# Patient Record
Sex: Female | Born: 1965 | Race: Black or African American | Hispanic: No | Marital: Married
Health system: Southern US, Community
[De-identification: ages and names within clinical notes are randomized; demographics above are authoritative.]

## PROBLEM LIST (undated history)

## (undated) HISTORY — PX: TONSILLECTOMY: SUR1361

## (undated) HISTORY — PX: ABDOMINAL HYSTERECTOMY: SHX81

---

## 1997-11-09 ENCOUNTER — Observation Stay (HOSPITAL_COMMUNITY): Admission: RE | Admit: 1997-11-09 | Discharge: 1997-11-10 | Payer: Self-pay | Admitting: *Deleted

## 1997-11-09 ENCOUNTER — Ambulatory Visit (HOSPITAL_COMMUNITY): Admission: RE | Admit: 1997-11-09 | Discharge: 1997-11-09 | Payer: Self-pay | Admitting: Obstetrics and Gynecology

## 1998-08-25 ENCOUNTER — Encounter: Payer: Self-pay | Admitting: Emergency Medicine

## 1998-08-25 ENCOUNTER — Emergency Department (HOSPITAL_COMMUNITY): Admission: EM | Admit: 1998-08-25 | Discharge: 1998-08-25 | Payer: Self-pay | Admitting: Emergency Medicine

## 1999-04-29 ENCOUNTER — Encounter: Admission: RE | Admit: 1999-04-29 | Discharge: 1999-04-29 | Payer: Self-pay | Admitting: Oncology

## 1999-04-29 ENCOUNTER — Encounter: Payer: Self-pay | Admitting: Oncology

## 2000-08-30 ENCOUNTER — Emergency Department (HOSPITAL_COMMUNITY): Admission: EM | Admit: 2000-08-30 | Discharge: 2000-08-30 | Payer: Self-pay | Admitting: Emergency Medicine

## 2000-08-30 ENCOUNTER — Encounter: Payer: Self-pay | Admitting: Emergency Medicine

## 2000-12-02 ENCOUNTER — Other Ambulatory Visit: Admission: RE | Admit: 2000-12-02 | Discharge: 2000-12-02 | Payer: Self-pay | Admitting: Obstetrics and Gynecology

## 2001-12-03 ENCOUNTER — Emergency Department (HOSPITAL_COMMUNITY): Admission: EM | Admit: 2001-12-03 | Discharge: 2001-12-03 | Payer: Self-pay | Admitting: Emergency Medicine

## 2001-12-04 ENCOUNTER — Encounter: Payer: Self-pay | Admitting: Emergency Medicine

## 2004-05-23 ENCOUNTER — Emergency Department (HOSPITAL_COMMUNITY): Admission: EM | Admit: 2004-05-23 | Discharge: 2004-05-23 | Payer: Self-pay | Admitting: Emergency Medicine

## 2004-08-29 ENCOUNTER — Encounter: Admission: RE | Admit: 2004-08-29 | Discharge: 2004-08-29 | Payer: Self-pay | Admitting: Family Medicine

## 2004-10-24 ENCOUNTER — Encounter: Admission: RE | Admit: 2004-10-24 | Discharge: 2004-10-24 | Payer: Self-pay | Admitting: Family Medicine

## 2006-11-14 ENCOUNTER — Emergency Department (HOSPITAL_COMMUNITY): Admission: EM | Admit: 2006-11-14 | Discharge: 2006-11-14 | Payer: Self-pay | Admitting: Emergency Medicine

## 2007-08-21 ENCOUNTER — Emergency Department (HOSPITAL_COMMUNITY): Admission: EM | Admit: 2007-08-21 | Discharge: 2007-08-21 | Payer: Self-pay | Admitting: Emergency Medicine

## 2009-12-07 ENCOUNTER — Ambulatory Visit (HOSPITAL_BASED_OUTPATIENT_CLINIC_OR_DEPARTMENT_OTHER)
Admission: RE | Admit: 2009-12-07 | Discharge: 2009-12-07 | Payer: Self-pay | Source: Home / Self Care | Admitting: Urology

## 2010-01-27 ENCOUNTER — Encounter: Payer: Self-pay | Admitting: *Deleted

## 2010-09-30 ENCOUNTER — Other Ambulatory Visit: Payer: Self-pay | Admitting: Family Medicine

## 2010-09-30 DIAGNOSIS — Z1231 Encounter for screening mammogram for malignant neoplasm of breast: Secondary | ICD-10-CM

## 2010-10-03 ENCOUNTER — Ambulatory Visit
Admission: RE | Admit: 2010-10-03 | Discharge: 2010-10-03 | Disposition: A | Discharge status: Home / Self Care | Payer: 59 | Source: Ambulatory Visit | Attending: Family Medicine | Admitting: Family Medicine

## 2010-10-03 DIAGNOSIS — Z1231 Encounter for screening mammogram for malignant neoplasm of breast: Secondary | ICD-10-CM

## 2010-10-04 ENCOUNTER — Other Ambulatory Visit: Payer: Self-pay | Admitting: Family Medicine

## 2010-10-04 DIAGNOSIS — N63 Unspecified lump in unspecified breast: Secondary | ICD-10-CM

## 2010-12-17 ENCOUNTER — Other Ambulatory Visit: Payer: Self-pay | Admitting: Family Medicine

## 2010-12-17 DIAGNOSIS — Z1231 Encounter for screening mammogram for malignant neoplasm of breast: Secondary | ICD-10-CM

## 2010-12-27 ENCOUNTER — Ambulatory Visit
Admission: RE | Admit: 2010-12-27 | Discharge: 2010-12-27 | Disposition: A | Discharge status: Home / Self Care | Payer: 59 | Source: Ambulatory Visit | Attending: Family Medicine | Admitting: Family Medicine

## 2010-12-27 DIAGNOSIS — Z1231 Encounter for screening mammogram for malignant neoplasm of breast: Secondary | ICD-10-CM

## 2011-01-06 ENCOUNTER — Other Ambulatory Visit: Payer: Self-pay | Admitting: Obstetrics and Gynecology

## 2011-01-06 DIAGNOSIS — R928 Other abnormal and inconclusive findings on diagnostic imaging of breast: Secondary | ICD-10-CM

## 2011-01-17 ENCOUNTER — Other Ambulatory Visit: Payer: Self-pay | Admitting: Obstetrics and Gynecology

## 2011-01-17 ENCOUNTER — Ambulatory Visit
Admission: RE | Admit: 2011-01-17 | Discharge: 2011-01-17 | Disposition: A | Discharge status: Home / Self Care | Payer: 59 | Source: Ambulatory Visit | Attending: Obstetrics and Gynecology | Admitting: Obstetrics and Gynecology

## 2011-01-17 DIAGNOSIS — R928 Other abnormal and inconclusive findings on diagnostic imaging of breast: Secondary | ICD-10-CM

## 2011-07-07 ENCOUNTER — Other Ambulatory Visit: Payer: Self-pay | Admitting: Family Medicine

## 2011-07-07 DIAGNOSIS — R921 Mammographic calcification found on diagnostic imaging of breast: Secondary | ICD-10-CM

## 2011-07-18 ENCOUNTER — Ambulatory Visit
Admission: RE | Admit: 2011-07-18 | Discharge: 2011-07-18 | Disposition: A | Discharge status: Home / Self Care | Payer: 59 | Source: Ambulatory Visit | Attending: Family Medicine | Admitting: Family Medicine

## 2011-07-18 ENCOUNTER — Other Ambulatory Visit: Payer: Self-pay | Admitting: Family Medicine

## 2011-07-18 DIAGNOSIS — R921 Mammographic calcification found on diagnostic imaging of breast: Secondary | ICD-10-CM

## 2012-12-08 ENCOUNTER — Other Ambulatory Visit: Payer: Self-pay | Admitting: Family Medicine

## 2013-03-07 ENCOUNTER — Other Ambulatory Visit: Payer: Self-pay | Admitting: Family Medicine

## 2013-03-07 DIAGNOSIS — R921 Mammographic calcification found on diagnostic imaging of breast: Secondary | ICD-10-CM

## 2013-03-07 DIAGNOSIS — D241 Benign neoplasm of right breast: Secondary | ICD-10-CM

## 2013-03-14 ENCOUNTER — Ambulatory Visit
Admission: RE | Admit: 2013-03-14 | Discharge: 2013-03-14 | Disposition: A | Discharge status: Home / Self Care | Payer: 59 | Source: Ambulatory Visit | Attending: Family Medicine | Admitting: Family Medicine

## 2013-03-14 DIAGNOSIS — R921 Mammographic calcification found on diagnostic imaging of breast: Secondary | ICD-10-CM

## 2013-03-14 DIAGNOSIS — D241 Benign neoplasm of right breast: Secondary | ICD-10-CM

## 2013-06-09 ENCOUNTER — Encounter: Payer: Self-pay | Admitting: Dietician

## 2013-06-09 ENCOUNTER — Encounter: Payer: 59 | Attending: Family Medicine | Admitting: Dietician

## 2013-06-09 VITALS — Ht 68.0 in | Wt 168.0 lb

## 2013-06-09 DIAGNOSIS — Z713 Dietary counseling and surveillance: Secondary | ICD-10-CM | POA: Insufficient documentation

## 2013-06-09 DIAGNOSIS — E663 Overweight: Secondary | ICD-10-CM

## 2013-06-09 DIAGNOSIS — Z6825 Body mass index (BMI) 25.0-25.9, adult: Secondary | ICD-10-CM | POA: Insufficient documentation

## 2013-06-09 NOTE — Patient Instructions (Signed)
For breakfast, try having either oatmeal with yogurt, a boiled egg and toast, or peanut butter with a rice cake, toast, or fruit, Have protein with carbs at meals and snacks. Try adding a small amount of carbs at meals.  Try to eat 3 meals a day. Work on getting enough sleep if possible.  Aim to get 30-60 minutes 5 x week when possible.

## 2013-06-09 NOTE — Progress Notes (Signed)
  Medical Nutrition Therapy:  Appt start time: 7824 end time:  1100.  Assessment:  Primary concerns today: Donna Gomez is here today since she is trying to lose weight and is having difficulty. Feels like she eats healthy and has maintaining the same weight for the past 8-9 months. Has always maintained weight for past 10-15 years and moved to [facility] for work in 2013 and gained about 10-15 lbs at that time. When she moved to [facility] in July 2014 she gained 20 more lbs. Before moving to [facility] she was 140-145 lbs. Was previously a size 4-6.   In the past has lost weight by cutting carbs. Allows herself to "badly" on Friday and Saturdays but never eats that badly.    Lives with her daughter and niece who are there temporarily. Cooks 3-4 x week and does not cook much on the weekend (goes out to eat). Bakes, broils, or grills and does not cook with salt.  Recovering from a broken foot and can't do as much activity as before. States that she is not sleeping well and feeling a lot of stress since she is unable to lose weight. Discussed the role that lack of adequate sleep and stress may play in difficulty with weight loss.   Preferred Learning Style:   No preference indicated   Learning Readiness:   Not ready  Contemplating  Ready  Change in progress  MEDICATIONS: see list   DIETARY INTAKE:  Usual eating pattern includes 3 meals and 1-2 snacks per day.  Everyday foods include salad.  Avoided foods include red meat, pork, chocolate, fried, ice cream  24-hr recall:  B ( AM): coffee with 2 splenda and coffee mate powder Snk ( AM): greek 100 calorie yogurt at work and fruit or smoothie with frozen fruit, coconut water, protein powder, and kale L ( PM): cafeteria - salad bar with lettuce and chicken sometimes cheese with fat free dressing or tuna on wheat or salad/turkey sandwich or protein shake or skips 1-2 x week Snk ( PM): fiber 1 bar or rice cakes and peanut butter or fruit  sometimes D ( PM): chicken/turkey/salmon with salads or vegetables Snk ( PM): nothing usually, on weekend may have goldfish or teddy grahams Beverages: water or coffee very rarely will have diet pepsi  Usual physical activity: physical therapy 2 x week, walking dog in morning, elliptical, healing after broken foot (before 3 x week cardio)  Estimated energy needs: 1800 calories 200 g carbohydrates 135 g protein 50 g fat  Progress Towards Goal(s):  In progress.   Nutritional Diagnosis:  [place]-3.3 Overweight/obesity As related to changes in diet when relocating and stress related to weight gain and inadequate sleep.  As evidenced by 25.5.    Intervention:  Nutrition counseling provided.  Plan: For breakfast, try having either oatmeal with yogurt, a boiled egg and toast, or peanut butter with a rice cake, toast, or fruit, Have protein with carbs at meals and snacks. Try adding a small amount of carbs at meals.  Try to eat 3 meals a day. Work on getting enough sleep if possible.  Aim to get 30-60 minutes 5 x week when possible.  Teaching Method Utilized:  Visual Auditory Hands on  Handouts given during visit include:  MyPlate Handout  15 g CHO Snack  Barriers to learning/adherence to lifestyle change: none  Demonstrated degree of understanding via:  Teach Back   Monitoring/Evaluation:  Dietary intake, exercise, and body weight prn.

## 2015-10-26 IMAGING — MG MM DIGITAL DIAGNOSTIC BILAT CAD
6 series · 6 of 6 positions shown · non-contrast
Comparison: Previous examinations.

CLINICAL DATA: Followup right breast probably benign calcifications
and probable fibroadenoma.

EXAM:
DIGITAL DIAGNOSTIC  BILATERAL MAMMOGRAM WITH CAD
ULTRASOUND RIGHT BREAST

[R CC (1 of 2)]
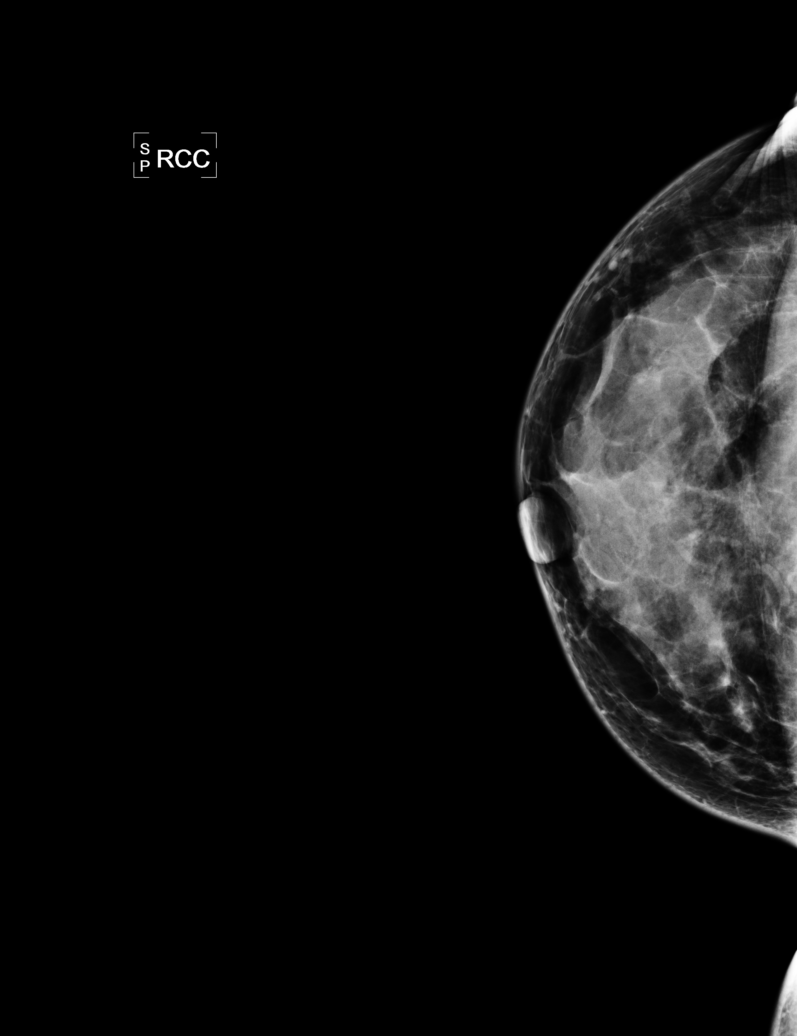

[L CC]
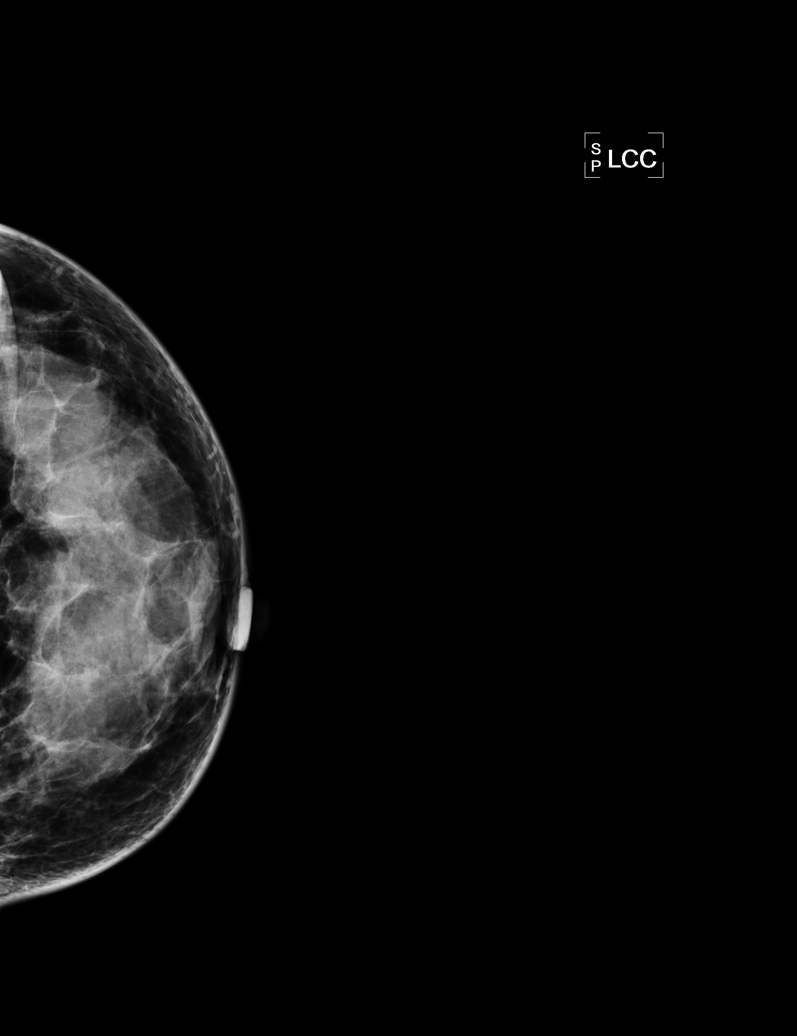

[L MLO]
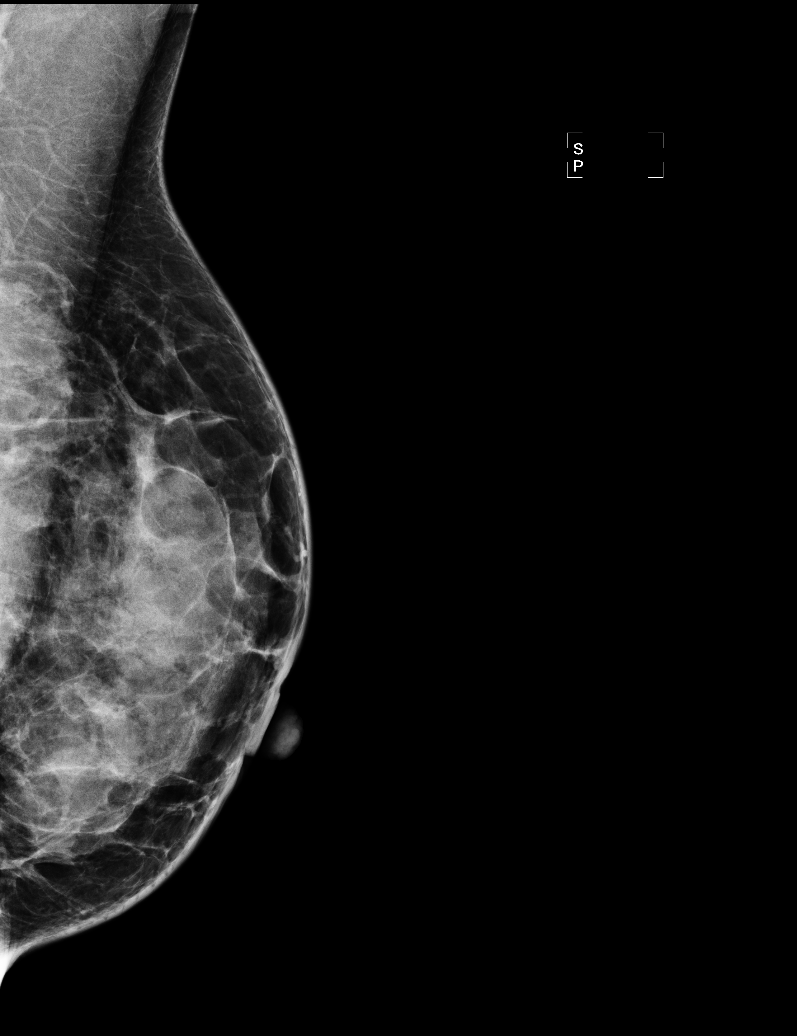

[R MLO]
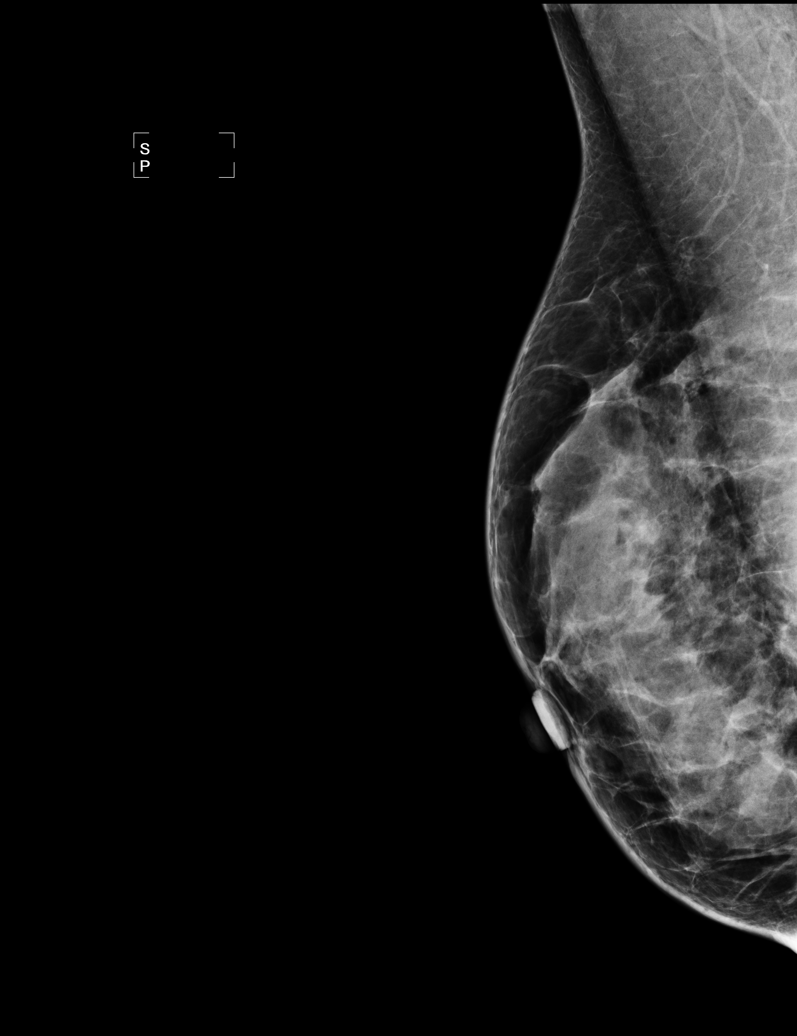

[R CC (2 of 2)]
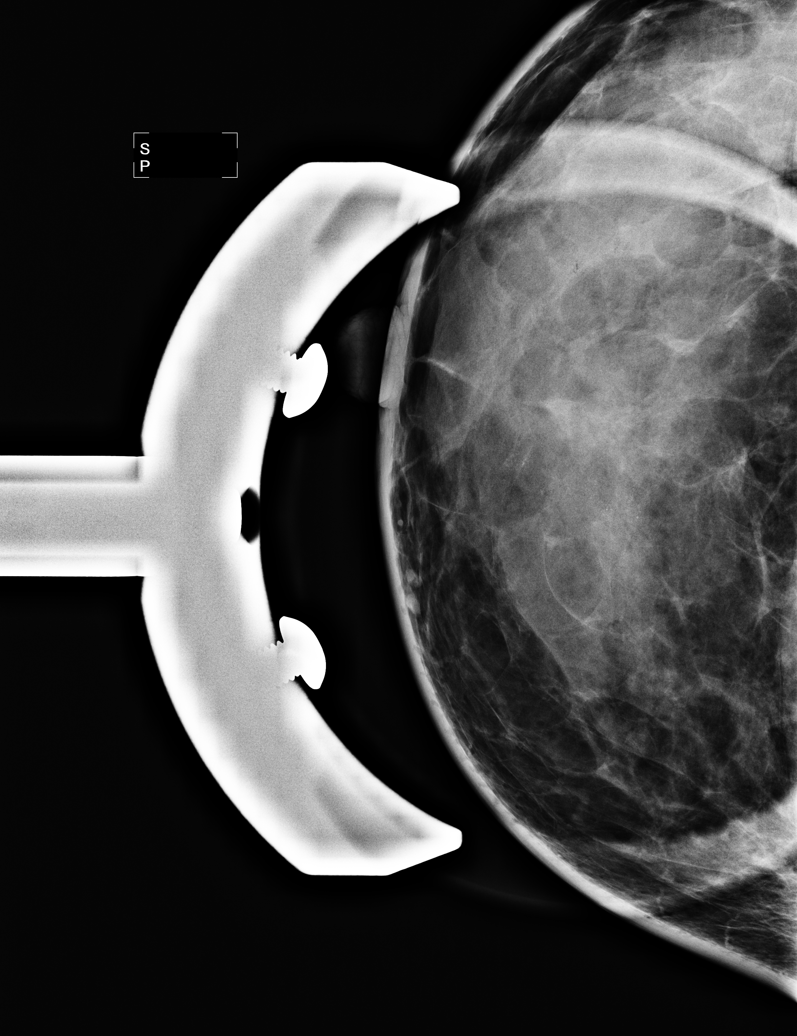

[R ML]
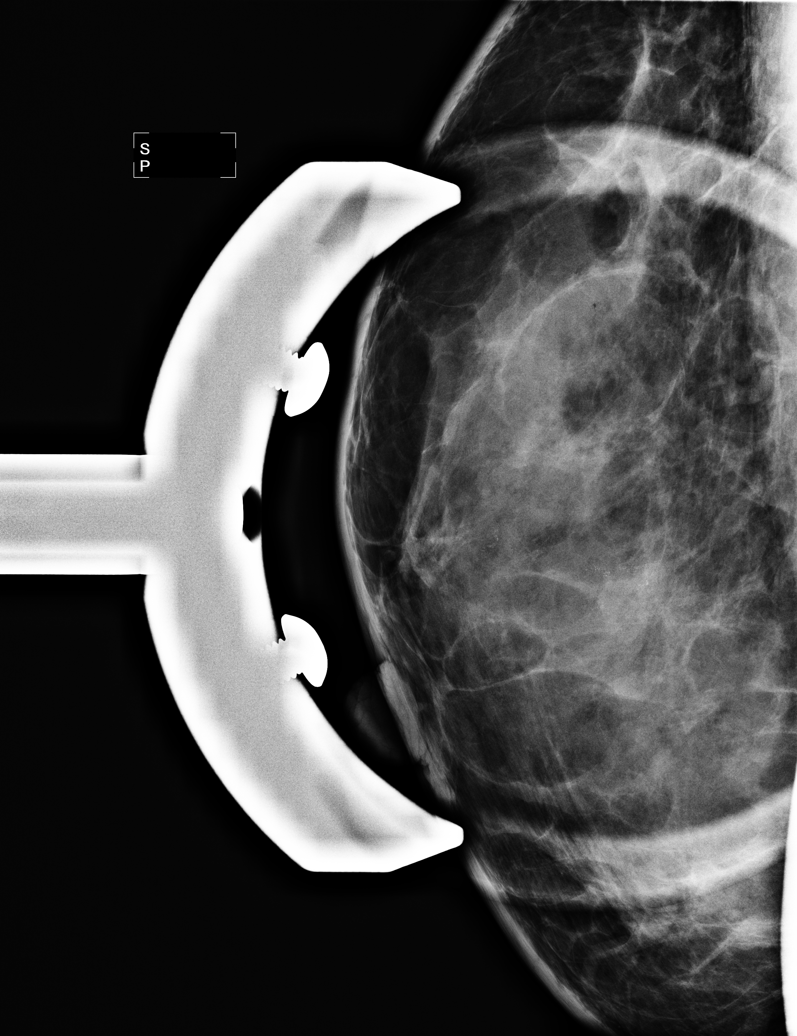

[6 of 6 positions shown; findings below may reference images not displayed]

ACR Breast Density Category d: The breast tissue is extremely dense,
which lowers the sensitivity of mammography.
FINDINGS: The previously evaluated probably benign right breast calcifications
are slightly less numerous and less prominent than seen on
01/17/2011 and 12/27/2010. No new findings suspicious for malignancy
in either breast.

Mammographic images were processed with CAD.

On physical exam, there is an approximately 4 mm oval, circumscribed
palpable mass in the 5:30 o'clock position of the right breast, 5 cm
from the nipple.

Ultrasound is performed, showing a 6 x 4 x 4 mm oval, horizontally
oriented, circumscribed, hypoechoic solid mass in the 5:30 o'clock
position of the right breast, 5 cm from the nipple. This measured 6
x 4 x 4 mm on 01/17/2011.
IMPRESSION: Benign right breast calcifications and benign right breast
fibroadenoma. No evidence of malignancy in either breast.

RECOMMENDATION:
Bilateral screening mammogram in 1 year.

I have discussed the findings and recommendations with the patient.
Results were also provided in writing at the conclusion of the
visit. If applicable, a reminder letter will be sent to the patient
regarding the next appointment.
